# Patient Record
Sex: Female | Born: 1970 | Marital: Married | Smoking: Never smoker
Health system: Southern US, Community
[De-identification: ages and names within clinical notes are randomized; demographics above are authoritative.]

---

## 2013-09-18 ENCOUNTER — Ambulatory Visit: Payer: Self-pay | Admitting: Family Medicine

## 2014-03-24 ENCOUNTER — Emergency Department: Payer: Self-pay | Admitting: Emergency Medicine

## 2014-09-21 ENCOUNTER — Ambulatory Visit
Admit: 2014-09-21 | Disposition: A | Discharge status: Home / Self Care | Payer: Self-pay | Attending: Family Medicine | Admitting: Family Medicine

## 2014-09-23 ENCOUNTER — Ambulatory Visit
Admit: 2014-09-23 | Disposition: A | Discharge status: Home / Self Care | Payer: Self-pay | Attending: Family Medicine | Admitting: Family Medicine

## 2015-07-06 ENCOUNTER — Emergency Department
Admission: EM | Admit: 2015-07-06 | Discharge: 2015-07-06 | Disposition: A | Discharge status: Home / Self Care | Payer: BLUE CROSS/BLUE SHIELD | Attending: Emergency Medicine | Admitting: Emergency Medicine

## 2015-07-06 ENCOUNTER — Encounter: Payer: Self-pay | Admitting: Emergency Medicine

## 2015-07-06 ENCOUNTER — Emergency Department: Payer: BLUE CROSS/BLUE SHIELD

## 2015-07-06 DIAGNOSIS — S79911A Unspecified injury of right hip, initial encounter: Secondary | ICD-10-CM | POA: Diagnosis not present

## 2015-07-06 DIAGNOSIS — Y9389 Activity, other specified: Secondary | ICD-10-CM | POA: Insufficient documentation

## 2015-07-06 DIAGNOSIS — Y9289 Other specified places as the place of occurrence of the external cause: Secondary | ICD-10-CM | POA: Insufficient documentation

## 2015-07-06 DIAGNOSIS — M5431 Sciatica, right side: Secondary | ICD-10-CM | POA: Insufficient documentation

## 2015-07-06 DIAGNOSIS — W01198A Fall on same level from slipping, tripping and stumbling with subsequent striking against other object, initial encounter: Secondary | ICD-10-CM | POA: Insufficient documentation

## 2015-07-06 DIAGNOSIS — S3992XA Unspecified injury of lower back, initial encounter: Secondary | ICD-10-CM | POA: Diagnosis present

## 2015-07-06 DIAGNOSIS — Y998 Other external cause status: Secondary | ICD-10-CM | POA: Insufficient documentation

## 2015-07-06 DIAGNOSIS — W19XXXA Unspecified fall, initial encounter: Secondary | ICD-10-CM

## 2015-07-06 DIAGNOSIS — S39012A Strain of muscle, fascia and tendon of lower back, initial encounter: Secondary | ICD-10-CM | POA: Diagnosis not present

## 2015-07-06 MED ORDER — NAPROXEN 500 MG PO TABS: 500.0000 mg | ORAL_TABLET | Freq: Two times a day (BID) | ORAL | Status: AC

## 2015-07-06 MED ORDER — CYCLOBENZAPRINE HCL 10 MG PO TABS: 10.0000 mg | ORAL_TABLET | Freq: Every day | ORAL | Status: DC

## 2015-07-06 NOTE — ED Provider Notes (Signed)
CSN: [id number]     Arrival date & time 07/06/15  0758 History   First MD Initiated Contact with Patient 07/06/15 [phone]     Chief Complaint  Patient presents with  . Fall     HPI Comments: 45 year old female presents today complaining of low back pain secondary to fall that occurred two days ago. Pt reports she fell backward and hit a metal piece of her bed frame that night. Since then she has had pain in her right low back and hip, much worse with standing. No tingling or numbness down her leg. Take motrin with minimal relief.     History reviewed. No pertinent past medical history. History reviewed. No pertinent past surgical history. No family history on file. Social History  Substance Use Topics  . Smoking status: Never Smoker   . Smokeless tobacco: None  . Alcohol Use: No   OB History    No data available     Review of Systems  Musculoskeletal: Positive for myalgias, back pain and arthralgias.  Neurological: Negative for weakness and numbness.  All other systems reviewed and are negative.     Allergies  Review of patient's allergies indicates no known allergies.  Home Medications   Prior to Admission medications   Medication Sig Start Date End Date Taking? Authorizing Provider  cyclobenzaprine (FLEXERIL) 10 MG tablet Take 1 tablet (10 mg total) by mouth at bedtime. 07/06/15   Christella Scheuermann, PA-C  naproxen (NAPROSYN) 500 MG tablet Take 1 tablet (500 mg total) by mouth 2 (two) times daily with a meal. 07/06/15   Christella Scheuermann, PA-C   BP 106/73 mmHg  Pulse 76  Temp(Src) 98.2 F (36.8 C) (Oral)  Resp 18  Wt 66 kg  SpO2 97%  LMP  Physical Exam  Constitutional: She is oriented to person, place, and time. Vital signs are normal. She appears well-developed and well-nourished. She is active.  Non-toxic appearance. She does not have a sickly appearance. She does not appear ill.  HENT:  Head: Normocephalic and atraumatic.  Cardiovascular: Intact distal pulses.     Musculoskeletal: Normal range of motion. She exhibits tenderness.       Right hip: She exhibits tenderness. She exhibits normal range of motion, normal strength and no swelling.       Left hip: Normal. She exhibits normal range of motion.       Lumbar back: She exhibits tenderness. She exhibits normal range of motion and no bony tenderness.       Back:  Neurological: She is alert and oriented to person, place, and time. She exhibits normal muscle tone.  Strength 5/5 bilateral LE. SLR + at 40 degrees on the right. Negative SLR on the left. Great toe raise equal bilaterally   Skin: Skin is warm and dry.  Psychiatric: She has a normal mood and affect. Her behavior is normal. Judgment and thought content normal.  Vitals reviewed.   ED Course  Procedures (including critical care time) Labs Review Labs Reviewed - No data to display  Imaging Review Dg Lumbar Spine 2-3 Views  07/06/2015  CLINICAL DATA:  Low back pain status post fall. EXAM: LUMBAR SPINE - 2-3 VIEW COMPARISON:  None. FINDINGS: There is no evidence of lumbar spine fracture. Alignment is normal. There is degenerative disc disease at T10-11 and T11-12. Intervertebral disc spaces are otherwise maintained. IMPRESSION: No acute osseous injury of the lumbar spine. Electronically Signed   By: Elige Ko   On: 07/06/2015  08:44   Dg Hip Unilat With Pelvis 2-3 Views Right  07/06/2015  CLINICAL DATA:  Fall with right hip and lower back pain. Injury was on Sunday. Initial encounter. EXAM: DG HIP (WITH OR WITHOUT PELVIS) 2-3V RIGHT COMPARISON:  None. FINDINGS: There is no evidence of hip fracture or dislocation. There is no evidence of arthropathy or other focal bone abnormality. IMPRESSION: Negative. Electronically Signed   By: Marnee Spring M.D.   On: 07/06/2015 08:44   I have personally reviewed and evaluated these images and lab results as part of my medical decision-making.   EKG Interpretation None      MDM  Discussed with  patient results - no evidence of fracture. Likely muscular strain with some associated right sided sciatica Naproxen  BID with food - no motrin while taking Flexeril QHS - drowsiness warning Follow up with PCP in 2 days  Final diagnoses:  Fall  Lumbar strain, initial encounter  Sciatica of right side        Sherry Serrano 07/06/15 0849  Myrna Blazer, MD 07/06/15 [phone]

## 2015-07-06 NOTE — ED Notes (Signed)
States she was sitting on side of son's bed and slipped off hit her right hip/lower back area on sunday

## 2015-07-06 NOTE — Discharge Instructions (Signed)

## 2015-07-06 NOTE — ED Notes (Signed)
States she hit a metal piece on a bed frame on Sunday  Having increased pain with standing and ambulation since Sunday. min relief with ibuprofen

## 2016-10-02 ENCOUNTER — Other Ambulatory Visit: Payer: Self-pay | Admitting: Family Medicine

## 2016-10-02 DIAGNOSIS — Z1231 Encounter for screening mammogram for malignant neoplasm of breast: Secondary | ICD-10-CM

## 2016-11-21 ENCOUNTER — Encounter: Payer: Self-pay | Admitting: Radiology

## 2016-11-21 ENCOUNTER — Ambulatory Visit
Admission: RE | Admit: 2016-11-21 | Discharge: 2016-11-21 | Disposition: A | Discharge status: Home / Self Care | Payer: BLUE CROSS/BLUE SHIELD | Source: Ambulatory Visit | Attending: Family Medicine | Admitting: Family Medicine

## 2016-11-21 DIAGNOSIS — Z1231 Encounter for screening mammogram for malignant neoplasm of breast: Secondary | ICD-10-CM

## 2017-11-02 ENCOUNTER — Other Ambulatory Visit: Payer: Self-pay | Admitting: Family Medicine

## 2017-11-02 DIAGNOSIS — Z1231 Encounter for screening mammogram for malignant neoplasm of breast: Secondary | ICD-10-CM

## 2017-11-22 ENCOUNTER — Ambulatory Visit
Admission: RE | Admit: 2017-11-22 | Discharge: 2017-11-22 | Disposition: A | Discharge status: Home / Self Care | Payer: BLUE CROSS/BLUE SHIELD | Source: Ambulatory Visit | Attending: Family Medicine | Admitting: Family Medicine

## 2017-11-22 DIAGNOSIS — Z1231 Encounter for screening mammogram for malignant neoplasm of breast: Secondary | ICD-10-CM | POA: Diagnosis not present

## 2020-12-08 ENCOUNTER — Other Ambulatory Visit: Payer: Self-pay | Admitting: Family Medicine

## 2020-12-08 DIAGNOSIS — Z1231 Encounter for screening mammogram for malignant neoplasm of breast: Secondary | ICD-10-CM

## 2021-02-22 ENCOUNTER — Ambulatory Visit
Admission: RE | Admit: 2021-02-22 | Discharge: 2021-02-22 | Disposition: A | Discharge status: Home / Self Care | Payer: BLUE CROSS/BLUE SHIELD | Source: Ambulatory Visit | Attending: Family Medicine | Admitting: Family Medicine

## 2021-02-22 ENCOUNTER — Other Ambulatory Visit: Payer: Self-pay

## 2021-02-22 DIAGNOSIS — Z1231 Encounter for screening mammogram for malignant neoplasm of breast: Secondary | ICD-10-CM | POA: Diagnosis not present

## 2021-02-25 ENCOUNTER — Other Ambulatory Visit: Payer: Self-pay | Admitting: Family Medicine

## 2021-02-25 DIAGNOSIS — N6489 Other specified disorders of breast: Secondary | ICD-10-CM

## 2021-02-25 DIAGNOSIS — R928 Other abnormal and inconclusive findings on diagnostic imaging of breast: Secondary | ICD-10-CM

## 2021-03-01 ENCOUNTER — Other Ambulatory Visit: Payer: Self-pay

## 2021-03-01 ENCOUNTER — Ambulatory Visit
Admission: RE | Admit: 2021-03-01 | Discharge: 2021-03-01 | Disposition: A | Discharge status: Home / Self Care | Payer: BLUE CROSS/BLUE SHIELD | Source: Ambulatory Visit | Attending: Family Medicine | Admitting: Family Medicine

## 2021-03-01 DIAGNOSIS — N6489 Other specified disorders of breast: Secondary | ICD-10-CM | POA: Insufficient documentation

## 2021-03-01 DIAGNOSIS — R928 Other abnormal and inconclusive findings on diagnostic imaging of breast: Secondary | ICD-10-CM | POA: Diagnosis not present

## 2021-03-10 ENCOUNTER — Other Ambulatory Visit: Payer: Self-pay | Admitting: Family Medicine

## 2021-03-10 DIAGNOSIS — N6320 Unspecified lump in the left breast, unspecified quadrant: Secondary | ICD-10-CM

## 2022-01-05 ENCOUNTER — Other Ambulatory Visit: Payer: Self-pay

## 2022-01-05 DIAGNOSIS — R928 Other abnormal and inconclusive findings on diagnostic imaging of breast: Secondary | ICD-10-CM

## 2022-01-10 ENCOUNTER — Encounter: Payer: Self-pay | Admitting: Family Medicine

## 2022-01-18 ENCOUNTER — Ambulatory Visit: Payer: BLUE CROSS/BLUE SHIELD

## 2022-01-20 ENCOUNTER — Ambulatory Visit: Payer: BLUE CROSS/BLUE SHIELD

## 2022-01-20 ENCOUNTER — Ambulatory Visit: Admission: RE | Admit: 2022-01-20 | Payer: BLUE CROSS/BLUE SHIELD | Source: Ambulatory Visit

## 2022-01-25 ENCOUNTER — Encounter: Payer: Self-pay | Admitting: Family Medicine

## 2022-02-12 ENCOUNTER — Other Ambulatory Visit: Payer: Self-pay

## 2022-02-12 ENCOUNTER — Encounter: Payer: Self-pay | Admitting: Intensive Care

## 2022-02-12 ENCOUNTER — Emergency Department: Payer: BLUE CROSS/BLUE SHIELD

## 2022-02-12 DIAGNOSIS — Y93E5 Activity, floor mopping and cleaning: Secondary | ICD-10-CM | POA: Diagnosis not present

## 2022-02-12 DIAGNOSIS — S0990XA Unspecified injury of head, initial encounter: Secondary | ICD-10-CM | POA: Diagnosis present

## 2022-02-12 DIAGNOSIS — S0101XA Laceration without foreign body of scalp, initial encounter: Secondary | ICD-10-CM | POA: Insufficient documentation

## 2022-02-12 DIAGNOSIS — Z23 Encounter for immunization: Secondary | ICD-10-CM | POA: Diagnosis not present

## 2022-02-12 DIAGNOSIS — W01198A Fall on same level from slipping, tripping and stumbling with subsequent striking against other object, initial encounter: Secondary | ICD-10-CM | POA: Insufficient documentation

## 2022-02-12 DIAGNOSIS — Y92009 Unspecified place in unspecified non-institutional (private) residence as the place of occurrence of the external cause: Secondary | ICD-10-CM | POA: Diagnosis not present

## 2022-02-12 MED ORDER — OXYCODONE-ACETAMINOPHEN 5-325 MG PO TABS
1.0000 | ORAL_TABLET | Freq: Once | ORAL | Status: AC
  Administered 2022-02-12: 1 via ORAL
  Filled 2022-02-12: qty 1

## 2022-02-12 NOTE — ED Triage Notes (Signed)
Patient slipped while mopping floor and hit her head on door at home. Bleeding noted to back, right side of head. Denies loc. Denies blood thinners

## 2022-02-13 ENCOUNTER — Emergency Department
Admission: EM | Admit: 2022-02-13 | Discharge: 2022-02-13 | Disposition: A | Discharge status: Home / Self Care | Payer: BLUE CROSS/BLUE SHIELD | Attending: Emergency Medicine | Admitting: Emergency Medicine

## 2022-02-13 DIAGNOSIS — S0990XA Unspecified injury of head, initial encounter: Secondary | ICD-10-CM

## 2022-02-13 DIAGNOSIS — S0101XA Laceration without foreign body of scalp, initial encounter: Secondary | ICD-10-CM

## 2022-02-13 DIAGNOSIS — W19XXXA Unspecified fall, initial encounter: Secondary | ICD-10-CM

## 2022-02-13 MED ORDER — IBUPROFEN 800 MG PO TABS
800.0000 mg | ORAL_TABLET | Freq: Once | ORAL | Status: AC
  Administered 2022-02-13: 800 mg via ORAL
  Filled 2022-02-13: qty 1

## 2022-02-13 MED ORDER — HYDROCODONE-ACETAMINOPHEN 5-325 MG PO TABS
1.0000 | ORAL_TABLET | Freq: Once | ORAL | Status: AC
  Administered 2022-02-13: 1 via ORAL
  Filled 2022-02-13: qty 1

## 2022-02-13 MED ORDER — ONDANSETRON 4 MG PO TBDP: 4.0000 mg | ORAL_TABLET | Freq: Three times a day (TID) | ORAL | 0 refills | Status: AC | PRN

## 2022-02-13 MED ORDER — IBUPROFEN 800 MG PO TABS: 800.0000 mg | ORAL_TABLET | Freq: Three times a day (TID) | ORAL | 0 refills | Status: AC | PRN

## 2022-02-13 MED ORDER — HYDROCODONE-ACETAMINOPHEN 5-325 MG PO TABS: 1.0000 | ORAL_TABLET | Freq: Four times a day (QID) | ORAL | 0 refills | Status: DC | PRN

## 2022-02-13 MED ORDER — TETANUS-DIPHTH-ACELL PERTUSSIS 5-2.5-18.5 LF-MCG/0.5 IM SUSY
0.5000 mL | PREFILLED_SYRINGE | Freq: Once | INTRAMUSCULAR | Status: AC
  Administered 2022-02-13: 0.5 mL via INTRAMUSCULAR
  Filled 2022-02-13: qty 0.5

## 2022-02-13 NOTE — ED Provider Notes (Signed)
[facility] Provider Note    Event Date/Time   First MD Initiated Contact with Patient 02/13/22 0148     (approximate)   History   Fall and Head Injury   HPI  Sherry Serrano is a 51 y.o. female who presents to the ED from home with a chief complaint of fall with head injury.  Patient slipped while mopping the floor, fell and struck her right head on the metal door frame.  Denies LOC.  Denies anticoagulant use.  Denies vision changes, neck pain, chest pain, shortness of breath, abdominal pain, nausea, vomiting or dizziness.  Tetanus is not up-to-date.     Past Medical History  History reviewed. No pertinent past medical history.   Active Problem List  There are no problems to display for this patient.    Past Surgical History   Past Surgical History:  Procedure Laterality Date   CESAREAN SECTION       Home Medications   Prior to Admission medications   Medication Sig Start Date End Date Taking? Authorizing Provider  cyclobenzaprine (FLEXERIL) 10 MG tablet Take 1 tablet (10 mg total) by mouth at bedtime. 07/06/15   Christella Scheuermann, PA-C  naproxen (NAPROSYN) 500 MG tablet Take 1 tablet (500 mg total) by mouth 2 (two) times daily with a meal. 07/06/15   Christella Scheuermann, PA-C     Allergies  Patient has no known allergies.   Family History  History reviewed. No pertinent family history.   Physical Exam  Triage Vital Signs: ED Triage Vitals  Enc Vitals Group     BP 02/12/22 1855 112/86     Pulse Rate 02/12/22 1855 86     Resp 02/12/22 1855 16     Temp 02/12/22 1855 98 F (36.7 C)     Temp Source 02/12/22 1855 Oral     SpO2 02/12/22 1855 100 %     Weight 02/12/22 1856 171 lb 15.3 oz (78 kg)     Height 02/12/22 1856 5\' 2"  (1.575 m)     Head Circumference --      Peak Flow --      Pain Score 02/12/22 1856 6     Pain Loc --      Pain Edu? --      Excl. in GC? --     Updated Vital Signs: BP 105/72 (BP Location: Right Arm)    Pulse 70   Temp 98.4 F (36.9 C) (Oral)   Resp 16   Ht 5\' 2"  (1.575 m)   Wt 78 kg   SpO2 95%   BMI 31.45 kg/m    General: Awake, no distress.  CV:  RRR.  Good peripheral perfusion.  Resp:  Normal effort.  CTA B. Abd:  Nontender.  No distention.  Other:  Unable to visualize scalp wound due to matted hair and blood.  Nose is atraumatic.  No dental malocclusion.  Cervical spine is nontender to palpation.  Alert and oriented x3.  CN II-XII grossly intact.  5/5 motor strength and sensation all extremities. MAEx4.   ED Results / Procedures / Treatments  Labs (all labs ordered are listed, but only abnormal results are displayed) Labs Reviewed - No data to display   EKG  None   RADIOLOGY I have independently visualized and interpreted patient's CT scans as well as noted the radiology interpretation:  CT head: No ICH  CT cervical spine: No acute traumatic injuries  Official radiology report(s): CT HEAD WO  CONTRAST ( )  Result Date: 02/12/2022 CLINICAL DATA:  Trip and fall. EXAM: CT HEAD WITHOUT CONTRAST CT CERVICAL SPINE WITHOUT CONTRAST TECHNIQUE: Multidetector CT imaging of the head and cervical spine was performed following the standard protocol without intravenous contrast. Multiplanar CT image reconstructions of the cervical spine were also generated. RADIATION DOSE REDUCTION: This exam was performed according to the departmental dose-optimization program which includes automated exposure control, adjustment of the mA and/or kV according to patient size and/or use of iterative reconstruction technique. COMPARISON:  None Available. FINDINGS: CT HEAD FINDINGS Brain: No evidence of acute infarction, hemorrhage, hydrocephalus, extra-axial collection or mass lesion/mass effect. Vascular: No hyperdense vessel or unexpected calcification. Skull: Normal. Negative for fracture or focal lesion. Sinuses/Orbits: No acute finding. Other: There is lateral right frontal scalp laceration. CT  CERVICAL SPINE FINDINGS Alignment: Normal. Skull base and vertebrae: No acute fracture. No primary bone lesion or focal pathologic process. Soft tissues and spinal canal: No prevertebral fluid or swelling. No visible canal hematoma. Disc levels: No significant central canal or neural foraminal stenosis at any level. Upper chest: Negative. Other: None. IMPRESSION: 1.  No acute intracranial process. 2. No acute fracture or traumatic subluxation of the cervical spine. 3.  Right frontal scalp laceration. Electronically Signed   By: Darliss Cheney M.D.   On: 02/12/2022 19:46   CT Cervical Spine Wo Contrast  Result Date: 02/12/2022 CLINICAL DATA:  Trip and fall. EXAM: CT HEAD WITHOUT CONTRAST CT CERVICAL SPINE WITHOUT CONTRAST TECHNIQUE: Multidetector CT imaging of the head and cervical spine was performed following the standard protocol without intravenous contrast. Multiplanar CT image reconstructions of the cervical spine were also generated. RADIATION DOSE REDUCTION: This exam was performed according to the departmental dose-optimization program which includes automated exposure control, adjustment of the mA and/or kV according to patient size and/or use of iterative reconstruction technique. COMPARISON:  None Available. FINDINGS: CT HEAD FINDINGS Brain: No evidence of acute infarction, hemorrhage, hydrocephalus, extra-axial collection or mass lesion/mass effect. Vascular: No hyperdense vessel or unexpected calcification. Skull: Normal. Negative for fracture or focal lesion. Sinuses/Orbits: No acute finding. Other: There is lateral right frontal scalp laceration. CT CERVICAL SPINE FINDINGS Alignment: Normal. Skull base and vertebrae: No acute fracture. No primary bone lesion or focal pathologic process. Soft tissues and spinal canal: No prevertebral fluid or swelling. No visible canal hematoma. Disc levels: No significant central canal or neural foraminal stenosis at any level. Upper chest: Negative. Other: None.  IMPRESSION: 1.  No acute intracranial process. 2. No acute fracture or traumatic subluxation of the cervical spine. 3.  Right frontal scalp laceration. Electronically Signed   By: Darliss Cheney M.D.   On: 02/12/2022 19:46     PROCEDURES:  Critical Care performed: No  ..Laceration Repair  Date/Time: 02/13/2022 3:11 AM  Performed by: Irean Hong, MD Authorized by: Irean Hong, MD   Consent:    Consent obtained:  Verbal   Consent given by:  Patient   Risks discussed:  Infection, pain, poor cosmetic result and poor wound healing Anesthesia:    Anesthesia method:  Topical application   Topical anesthesia: PainEase. Laceration details:    Location:  Scalp   Scalp location:  R parietal   Length (cm):  5   Depth (mm):  2 Exploration:    Hemostasis achieved with:  Direct pressure   Contaminated: no   Treatment:    Area cleansed with:  Saline (hydrogen peroxide)   Amount of cleaning:  Extensive   Irrigation solution:  Sterile saline   Visualized foreign bodies/material removed: no   Skin repair:    Repair method:  Staples   Number of staples:  5 Approximation:    Approximation:  Close Repair type:    Repair type:  Simple Post-procedure details:    Dressing:  Open (no dressing)   Procedure completion:  Tolerated well, no immediate complications    MEDICATIONS ORDERED IN ED: Medications  Tdap (BOOSTRIX) injection 0.5 mL (has no administration in time range)  oxyCODONE-acetaminophen (PERCOCET/ROXICET) 5-325 MG per tablet 1 tablet (1 tablet Oral Given 02/12/22 1913)     IMPRESSION / MDM / ASSESSMENT AND PLAN / ED COURSE  I reviewed the triage vital signs and the nursing notes.                             51 year old female presenting with head injury status post mechanical fall.  Differential diagnosis includes but is not limited to ICH, SDH, scalp laceration, concussion, cervical spine fracture/dislocation, etc.  Patient's presentation is most consistent with acute  complicated illness / injury requiring diagnostic workup.  CT scans negative for acute traumatic injuries.  Nursing will cleanse wound to be reexamined to see if patient requires staples.  We will update tetanus.  Clinical Course as of 02/13/22 0311  Mon Feb 13, 2022  0310 Wound reassessed after cleansing: Approximately 5 cm linear, nonbleeding laceration to right parietal scalp.  This was repaired with staples.  Strict return precautions given.  Will discharge home with as needed prescriptions for Motrin, Norco and Zofran.  Patient and family member verbalized understanding and agree with plan of care. [JS]    Clinical Course User Index [JS] Irean Hong, MD     FINAL CLINICAL IMPRESSION(S) / ED DIAGNOSES   Final diagnoses:  Fall, initial encounter  Minor head injury, initial encounter     Rx / DC Orders   ED Discharge Orders     None        Note:  This document was prepared using Dragon voice recognition software and may include unintentional dictation errors.   Irean Hong, MD 02/13/22 [phone]

## 2022-02-13 NOTE — Discharge Instructions (Addendum)
Staple removal in 7 to 10 days.  You may take medications as needed for pain and nausea.  Return to the ER for worsening symptoms, persistent vomiting, lethargy, difficulty breathing or other concerns.

## 2022-02-13 NOTE — ED Notes (Signed)
Pt Dc to home. Dc instructions reviewed with pt and family member with all questions answered. Pt verbalizes understanding. Pt assisted out of dept via wheelchair with family member.

## 2022-04-16 IMAGING — MG MM DIGITAL DIAGNOSTIC UNILAT*L* W/ TOMO W/ CAD
6 series · 6 of 18 positions shown · non-contrast
Comparison: Previous exam(s).

CLINICAL DATA: Callback for LEFT breast focal asymmetry.

EXAM:
DIGITAL DIAGNOSTIC UNILATERAL LEFT MAMMOGRAM WITH TOMOSYNTHESIS AND
CAD; ULTRASOUND LEFT BREAST LIMITED
TECHNIQUE: Left digital diagnostic mammography and breast tomosynthesis was
performed. The images were evaluated with computer-aided detection.;
Targeted ultrasound examination of the left breast was performed.

[L MLO synth-2D (1 of 2)]
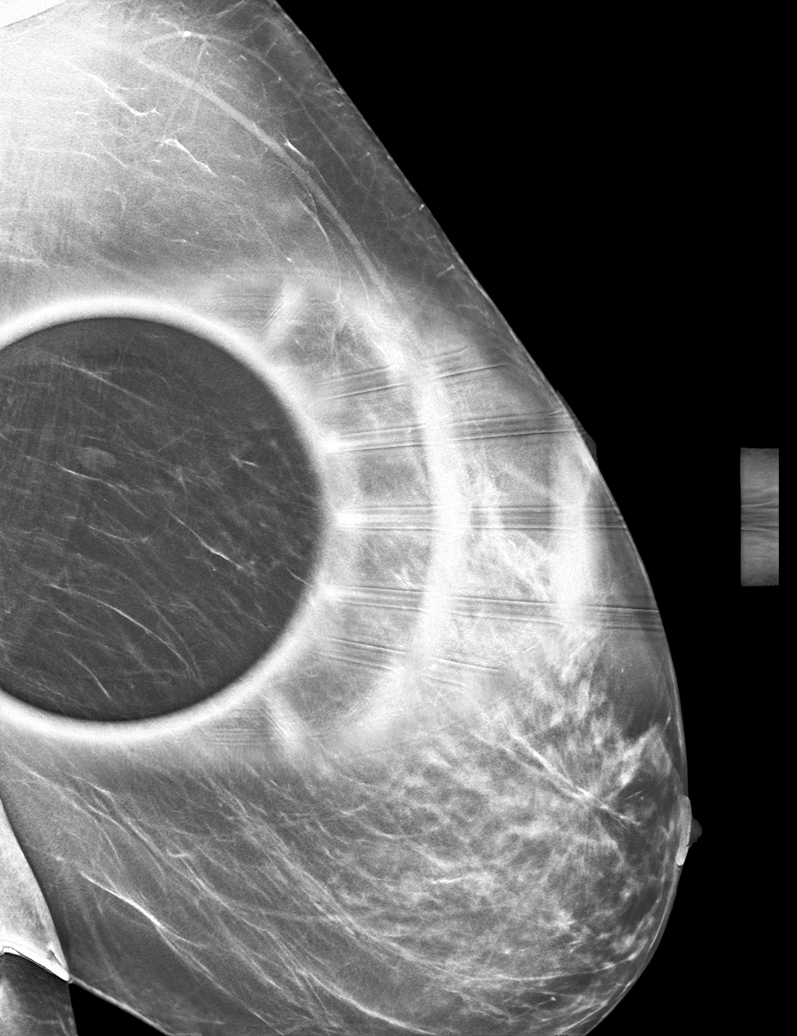

[L CC synth-2D]
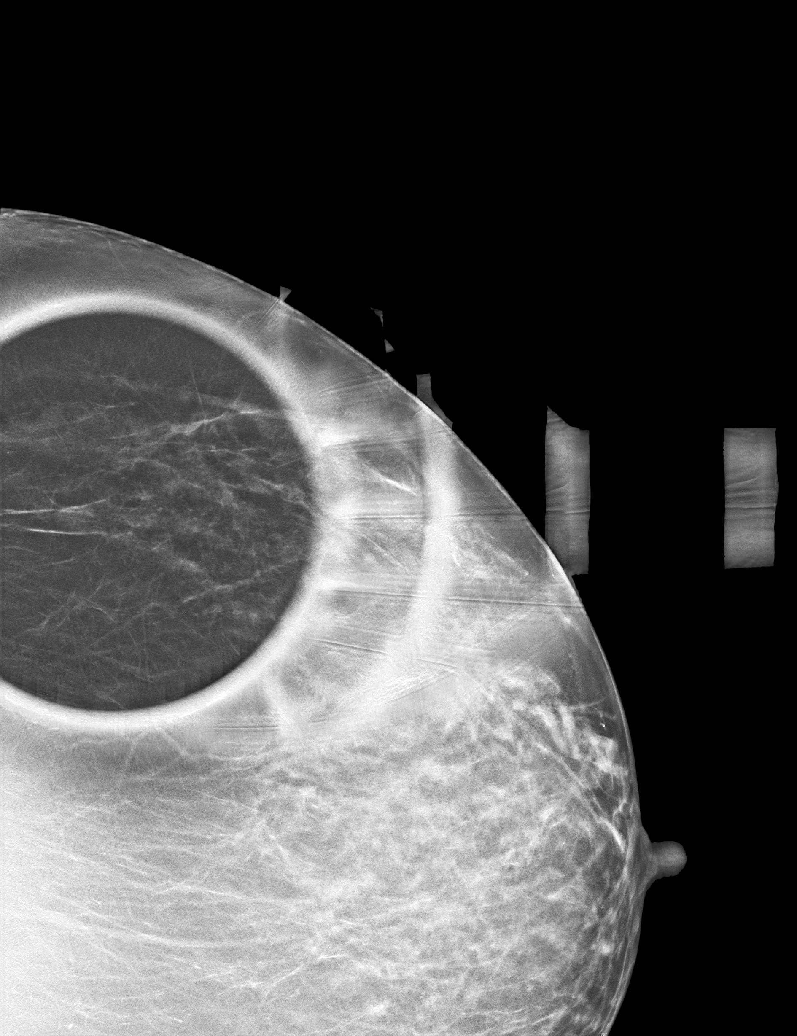

[L MLO synth-2D (2 of 2)]
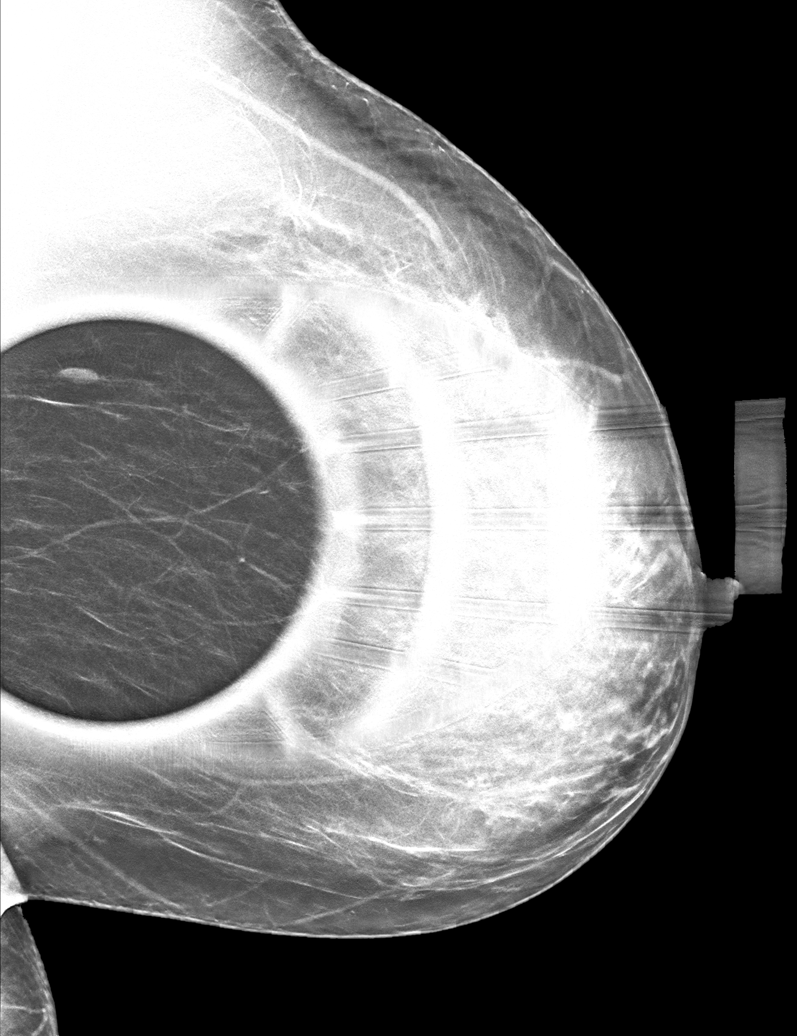

[L MLO tomo (1 of 2) · tomo slice 29/56.0]
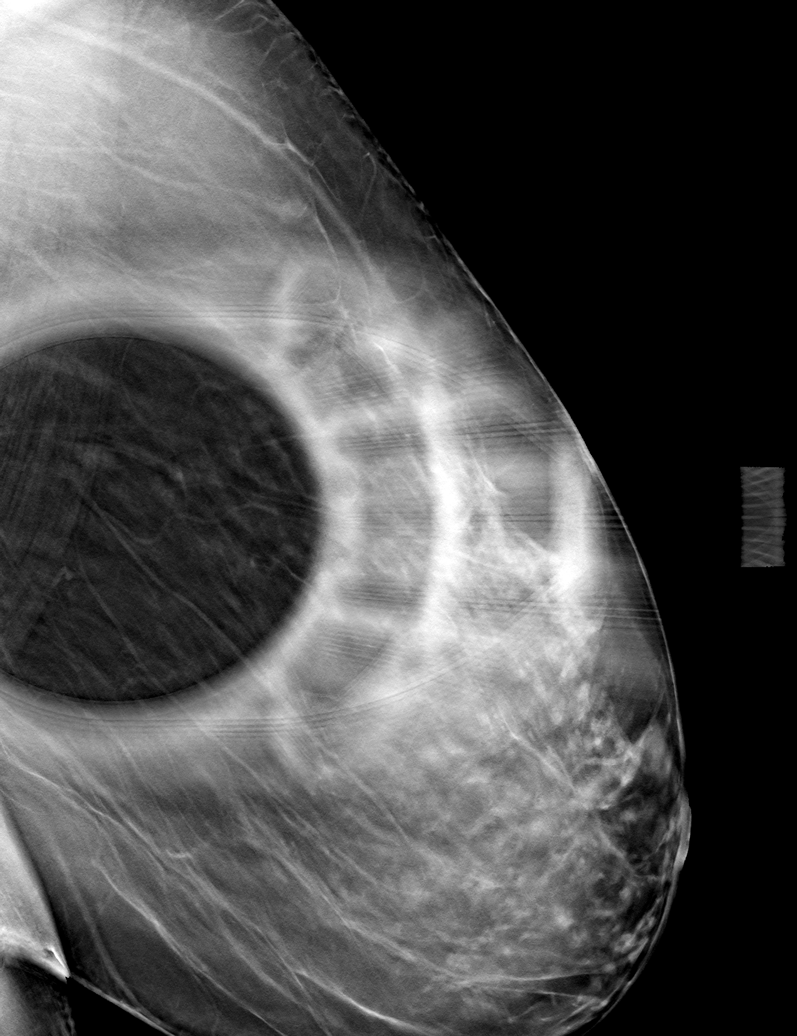

[L CC tomo · tomo slice 23/45.0]
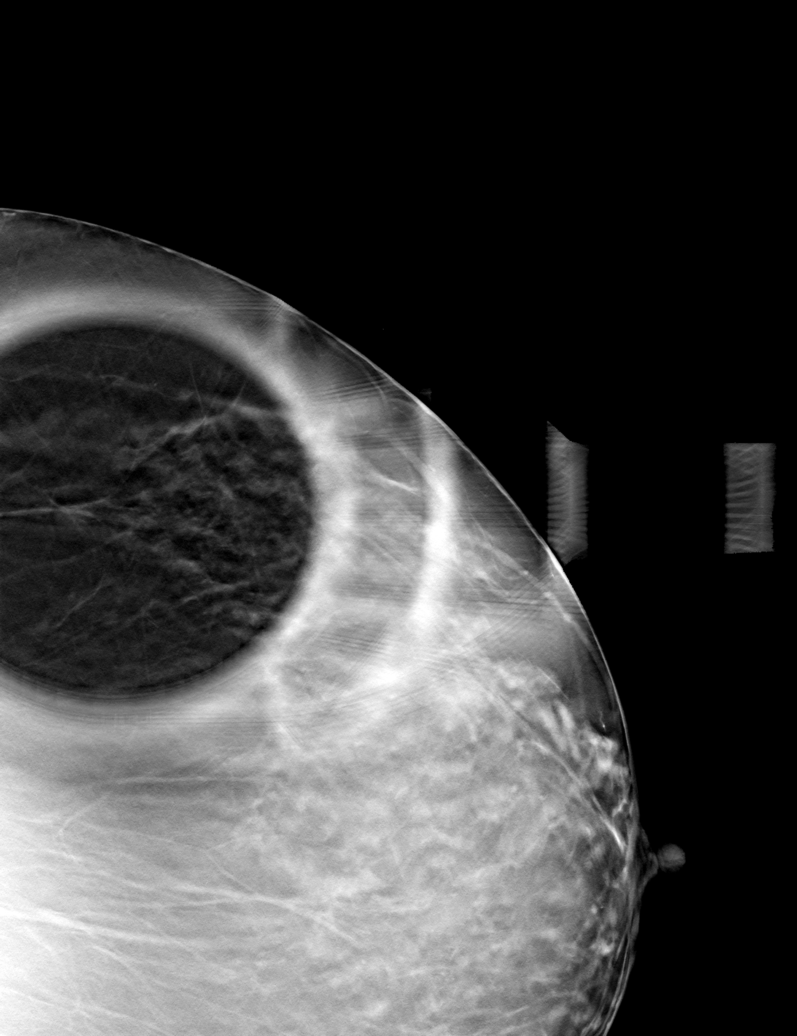

[L MLO tomo (2 of 2) · tomo slice 27/52.0]
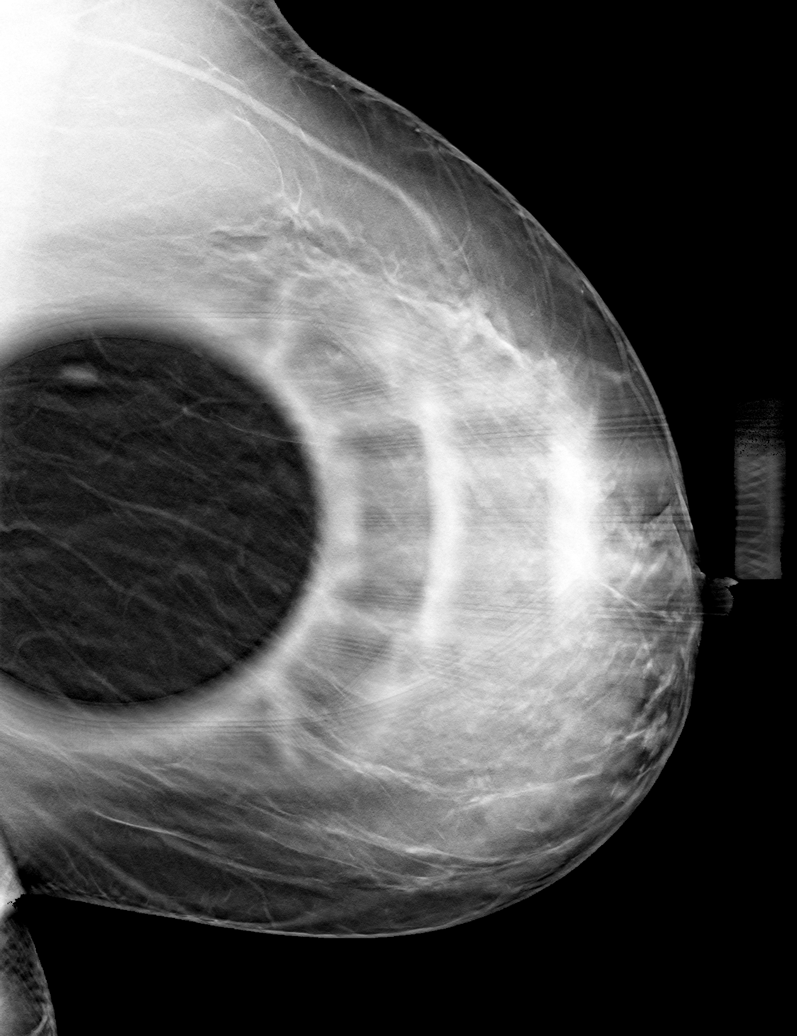

[6 of 18 positions shown; findings below may reference images not displayed]

ACR Breast Density Category c: The breast tissue is heterogeneously
dense, which may obscure small masses.
FINDINGS: Spot compression tomosynthesis views confirm persistence of an oval
circumscribed mass in the LEFT outer breast at posterior depth. This
may be newly conspicuous due to improved mammographic technique.
There is a suggestion that is mass may have been present on MLO
image dated November 22, 2017.

On physical exam, no suspicious mass is appreciated.

Targeted ultrasound was performed of the LEFT outer breast. At 3
o'clock 14 cm from the nipple, there is an oval circumscribed near
anechoic mass with suggestion of posterior acoustic enhancement. It
measures 7 x 3 x 8 mm. This corresponds to the site of screening
mammographic concern. No internal blood flow is identified. This may
reflect a complicated cyst or fibroadenoma.
IMPRESSION: 1. There is a probably benign mass in the LEFT outer breast at
posterior depth. This is favored to be newly conspicuous
mammographically due to improved mammographic technique. Recommend
follow-up mammogram and ultrasound in 6 months. This will establish
6 months of definitive stability.

RECOMMENDATION:
LEFT diagnostic mammogram and ultrasound in 6 months.

I have discussed the findings and recommendations with the patient.
If applicable, a reminder letter will be sent to the patient
regarding the next appointment.

BI-RADS CATEGORY  3: Probably benign.

## 2022-04-16 IMAGING — US US BREAST*L* LIMITED INC AXILLA
1 series · 6 of 6 positions shown · non-contrast
Comparison: Previous exam(s).

CLINICAL DATA: Callback for LEFT breast focal asymmetry.

EXAM:
DIGITAL DIAGNOSTIC UNILATERAL LEFT MAMMOGRAM WITH TOMOSYNTHESIS AND
CAD; ULTRASOUND LEFT BREAST LIMITED
TECHNIQUE: Left digital diagnostic mammography and breast tomosynthesis was
performed. The images were evaluated with computer-aided detection.;
Targeted ultrasound examination of the left breast was performed.

[Series 1: us breast*left* limited inc axilla · 0.06mm/px · 6 of 6 slices shown]
[im 1/6]
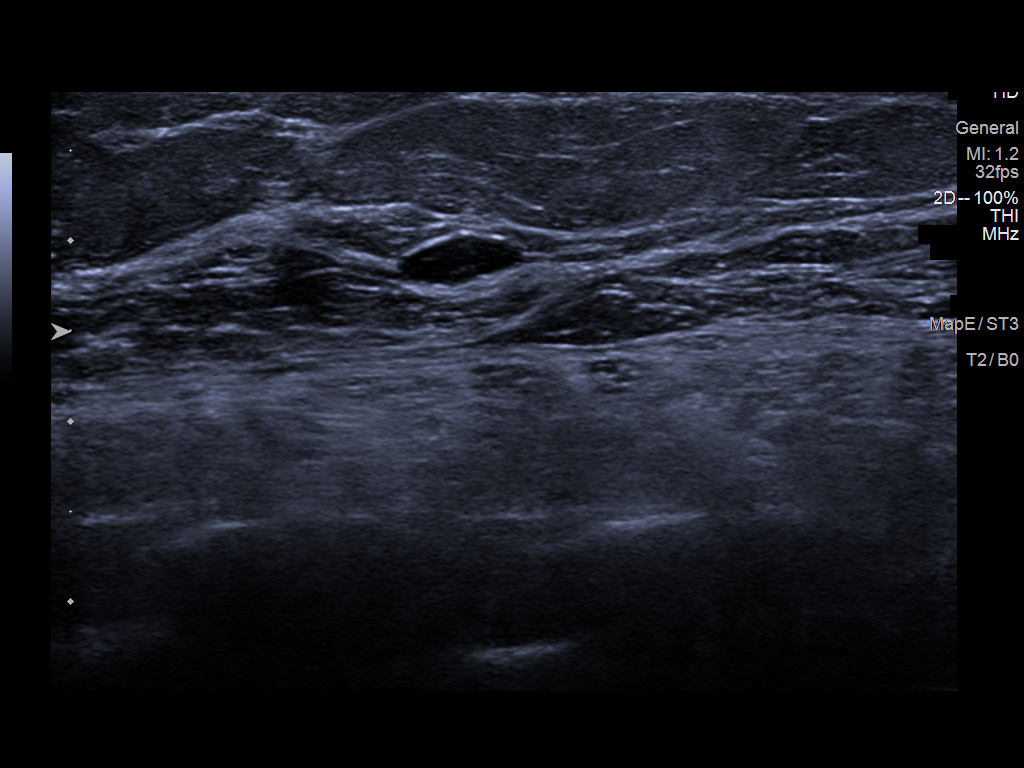
[im 2/6]
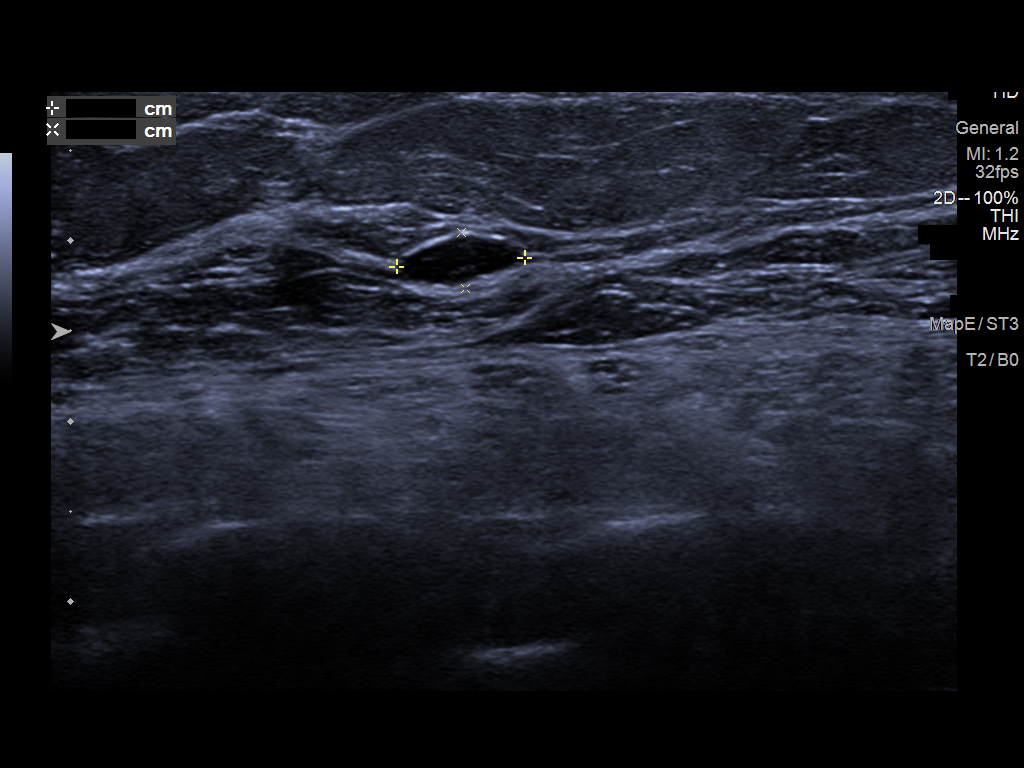
[im 3/6]
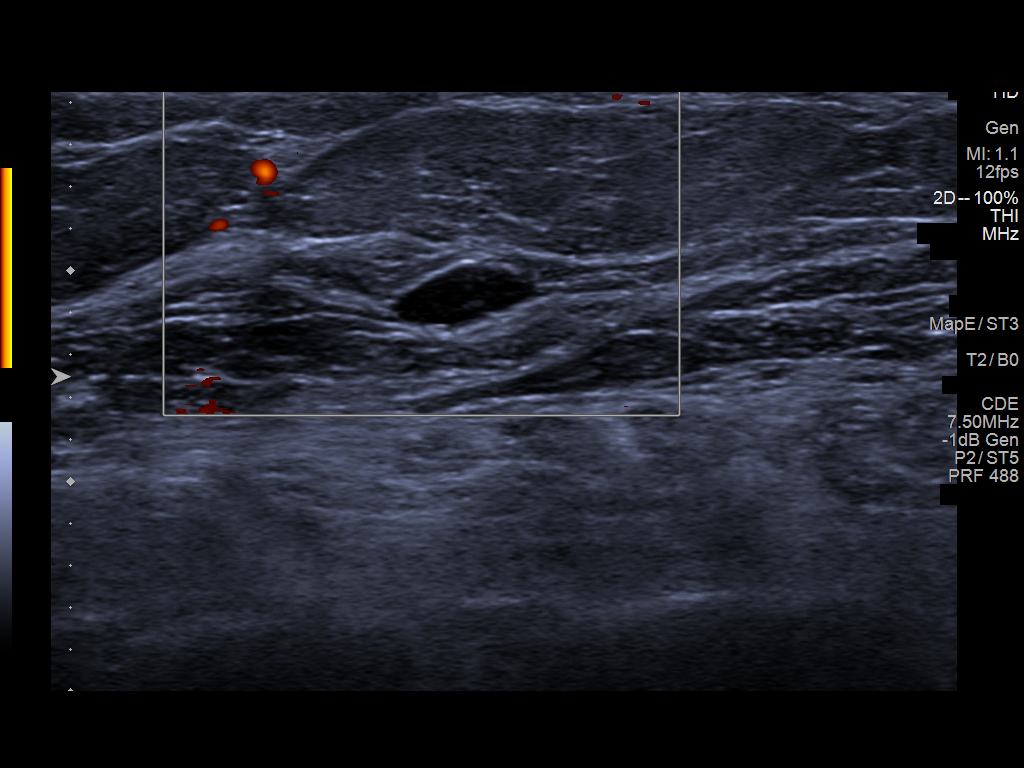
[im 4/6]
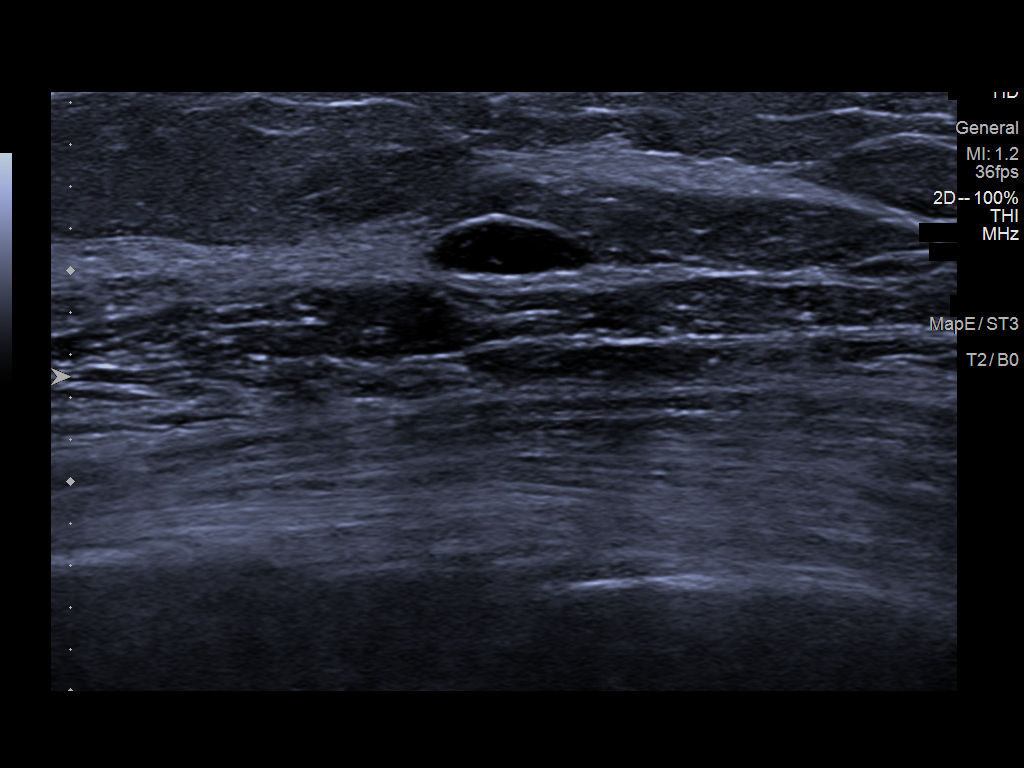
[im 5/6]
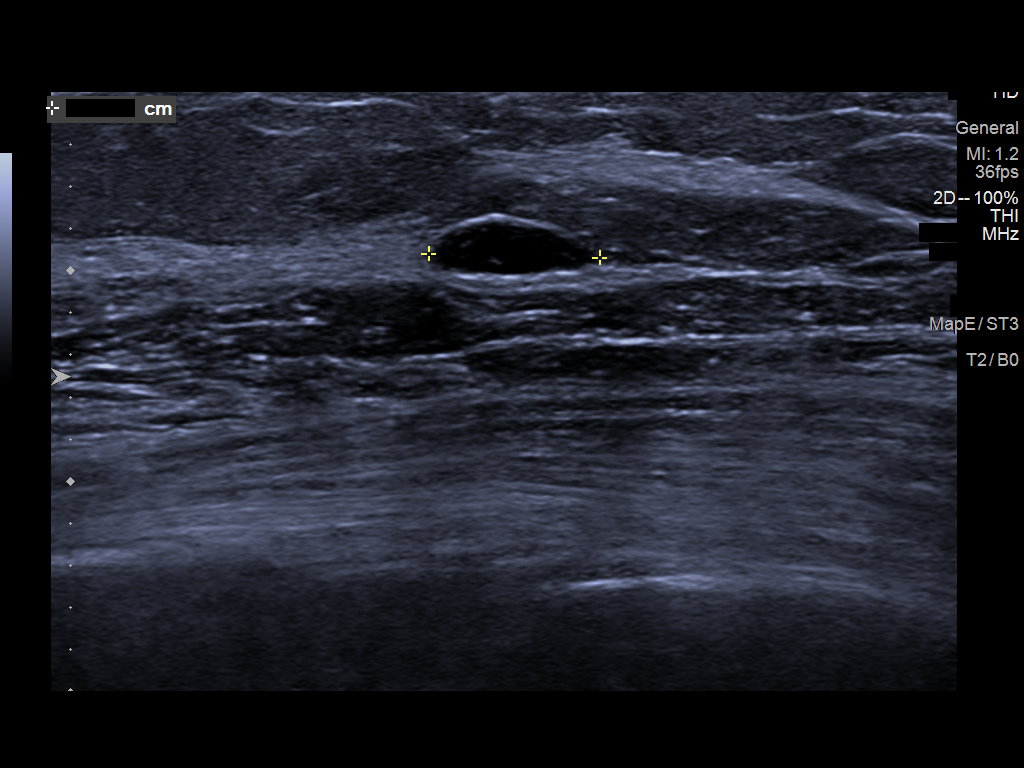
[im 6/6]
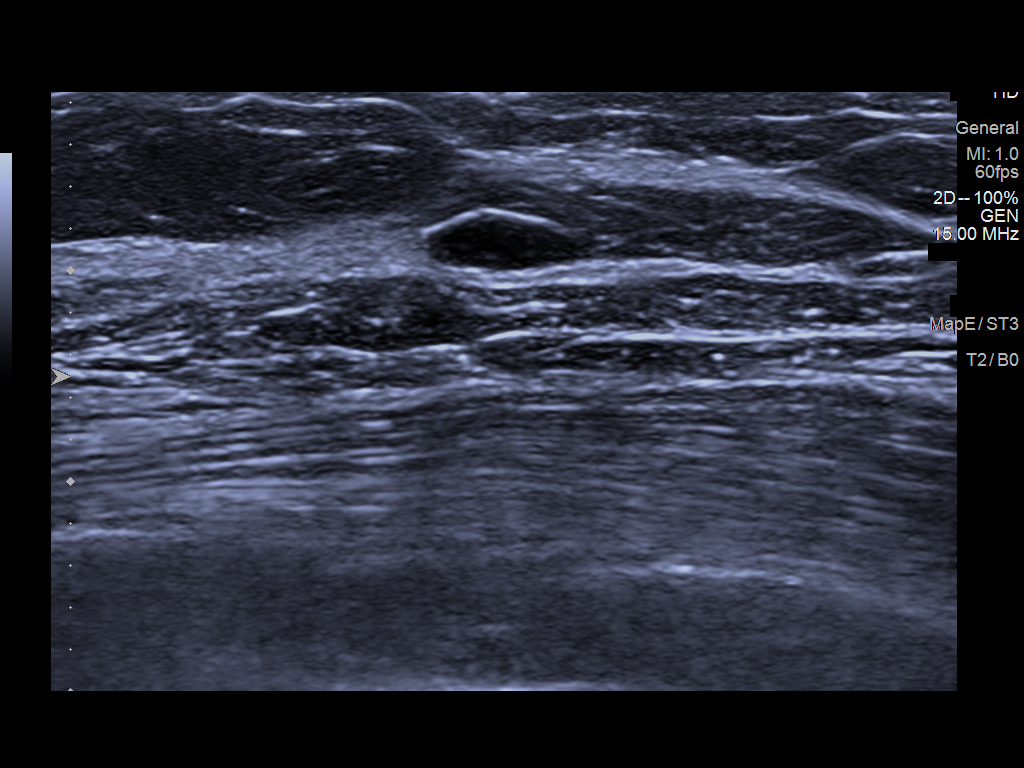

[6 of 6 positions shown; findings below may reference images not displayed]

ACR Breast Density Category c: The breast tissue is heterogeneously
dense, which may obscure small masses.
FINDINGS: Spot compression tomosynthesis views confirm persistence of an oval
circumscribed mass in the LEFT outer breast at posterior depth. This
may be newly conspicuous due to improved mammographic technique.
There is a suggestion that is mass may have been present on MLO
image dated November 22, 2017.

On physical exam, no suspicious mass is appreciated.

Targeted ultrasound was performed of the LEFT outer breast. At 3
o'clock 14 cm from the nipple, there is an oval circumscribed near
anechoic mass with suggestion of posterior acoustic enhancement. It
measures 7 x 3 x 8 mm. This corresponds to the site of screening
mammographic concern. No internal blood flow is identified. This may
reflect a complicated cyst or fibroadenoma.
IMPRESSION: 1. There is a probably benign mass in the LEFT outer breast at
posterior depth. This is favored to be newly conspicuous
mammographically due to improved mammographic technique. Recommend
follow-up mammogram and ultrasound in 6 months. This will establish
6 months of definitive stability.

RECOMMENDATION:
LEFT diagnostic mammogram and ultrasound in 6 months.

I have discussed the findings and recommendations with the patient.
If applicable, a reminder letter will be sent to the patient
regarding the next appointment.

BI-RADS CATEGORY  3: Probably benign.

## 2023-02-07 ENCOUNTER — Other Ambulatory Visit: Payer: Self-pay | Admitting: Family Medicine

## 2023-02-07 DIAGNOSIS — Z1231 Encounter for screening mammogram for malignant neoplasm of breast: Secondary | ICD-10-CM

## 2023-11-02 ENCOUNTER — Other Ambulatory Visit: Payer: Self-pay

## 2023-11-02 ENCOUNTER — Emergency Department
Admission: EM | Admit: 2023-11-02 | Discharge: 2023-11-02 | Disposition: A | Discharge status: Home / Self Care | Payer: Self-pay | Attending: Emergency Medicine | Admitting: Emergency Medicine

## 2023-11-02 DIAGNOSIS — M545 Low back pain, unspecified: Secondary | ICD-10-CM | POA: Insufficient documentation

## 2023-11-02 MED ORDER — OXYCODONE-ACETAMINOPHEN 7.5-325 MG PO TABS: 1.0000 | ORAL_TABLET | ORAL | 0 refills | Status: AC | PRN

## 2023-11-02 MED ORDER — OXYCODONE-ACETAMINOPHEN 5-325 MG PO TABS
1.0000 | ORAL_TABLET | Freq: Once | ORAL | Status: AC
  Administered 2023-11-02: 1 via ORAL
  Filled 2023-11-02: qty 1

## 2023-11-02 MED ORDER — CYCLOBENZAPRINE HCL 5 MG PO TABS: 5.0000 mg | ORAL_TABLET | Freq: Three times a day (TID) | ORAL | 0 refills | Status: AC | PRN

## 2023-11-02 NOTE — ED Provider Notes (Signed)
 [facility] Emergency Department Provider Note     Event Date/Time   First MD Initiated Contact with Patient 11/02/23 2036     (approximate)   History   Back Pain   HPI  Sherry Serrano is a 53 y.o. female with no significant past medical history presents to the ED for evaluation of bilateral low back pain without injury that started earlier today while helping her dad assemble a table.  Patient reports pain is worse with movement.  Denies fall or injury.  Denies recent illness, fever, urinary symptoms.  Denies loss of bladder control and bowel control and saddle anesthesia.     Physical Exam   Triage Vital Signs: ED Triage Vitals  Encounter Vitals Group     BP 11/02/23 1935 95/62     Systolic BP Percentile --      Diastolic BP Percentile --      Pulse Rate 11/02/23 1935 65     Resp 11/02/23 1935 18     Temp 11/02/23 1935 98 F (36.7 C)     Temp src --      SpO2 11/02/23 1935 100 %     Weight 11/02/23 1934 143 lb 4.8 oz (65 kg)     Height 11/02/23 1934 5\' 3"  (1.6 m)     Head Circumference --      Peak Flow --      Pain Score 11/02/23 1933 5     Pain Loc --      Pain Education --      Exclude from Growth Chart --     Most recent vital signs: Vitals:   11/02/23 1935 11/02/23 2251  BP: 95/62 102/70  Pulse: 65 64  Resp: 18 18  Temp: 98 F (36.7 C)   SpO2: 100% 100%    General: Alert and oriented. INAD.  Neck:   No cervical spine tenderness to palpation. Full ROM without difficulty.  CV:  Good peripheral perfusion.  RESP:  Normal effort. Aaron Aas  BACK:  Spinous process is midline without deformity or tenderness. Tenderness over bilateral paraspinal muscles of lumbar region.  MSK:   Full ROM in all joints. No swelling, deformity or tenderness.  NEURO: Cranial nerves intact. No focal deficits. Sensation and motor function intact. 5/5 muscle strength of LE. Gait is steady.    ED Results / Procedures / Treatments   Labs (all labs ordered  are listed, but only abnormal results are displayed) Labs Reviewed - No data to display  No results found.  PROCEDURES:  Critical Care performed: No  Procedures   MEDICATIONS ORDERED IN ED: Medications  oxyCODONE -acetaminophen  (PERCOCET/ROXICET) 5-325 MG per tablet 1 tablet (1 tablet Oral Given 11/02/23 1937)     IMPRESSION / MDM / ASSESSMENT AND PLAN / ED COURSE  I reviewed the triage vital signs and the nursing notes.                               53 y.o. female presents to the emergency department for evaluation and treatment of low back pain . See HPI for further details.   Differential diagnosis includes, but is not limited to muscle strain, radiculopathy  Patient's presentation is most consistent with acute complicated illness / injury requiring diagnostic workup.  Patient presents with intermittent lower back pain since last night.  She denies trauma, or urinary symptoms or red flag signs.  Pain improved significantly with ED  administer Percocet in triage.  The patient now reports complete resolution.  Further diagnostic workup was offered but the patient declined as she is feeling better and prefers to follow-up with her primary care provider.  This is reasonable.  Return precautions.  ED return precautions discussed.  FINAL CLINICAL IMPRESSION(S) / ED DIAGNOSES   Final diagnoses:  Bilateral low back pain without sciatica, unspecified chronicity     Rx / DC Orders   ED Discharge Orders          Ordered    cyclobenzaprine  (FLEXERIL ) 5 MG tablet  3 times daily PRN        11/02/23 2246    oxyCODONE -acetaminophen  (PERCOCET) 7.5-325 MG tablet  Every 4 hours PRN        11/02/23 2246           Note:  This document was prepared using Dragon voice recognition software and may include unintentional dictation errors.    Phyllis Breeze, Rosaland Shiffman A, PA-C 11/02/23 2341    Jacquie Maudlin, MD 11/04/23 [phone]

## 2023-11-02 NOTE — ED Triage Notes (Addendum)
 Pt reports lower back pain that she has had intermittently for the past few months. Pt reports today pain has been unbearable. Pt denies injury to area. Pt denies urinary problems. Pt states pain is worse with movement

## 2023-11-02 NOTE — ED Notes (Signed)
 Pt to ED via [facility] c/o mid lower back pain that started last night.

## 2023-11-02 NOTE — Discharge Instructions (Signed)
 Please follow-up with your primary care provider.  As discussed if symptoms persist or worsen please return to ED for other workup.

## 2023-11-02 NOTE — ED Notes (Signed)
 Alvaro Augusta, PA at bedside with pt at this time.
